# Patient Record
Sex: Female | Born: 1980 | Race: White | Hispanic: No | Marital: Married | Smoking: Current every day smoker
Health system: Southern US, Community
[De-identification: ages and names within clinical notes are randomized; demographics above are authoritative.]

## PROBLEM LIST (undated history)

## (undated) DIAGNOSIS — M5430 Sciatica, unspecified side: Secondary | ICD-10-CM

---

## 2010-08-24 HISTORY — PX: ELBOW SURGERY: SHX618

## 2011-08-25 HISTORY — PX: TOE SURGERY: SHX1073

## 2014-12-05 HISTORY — PX: OTHER SURGICAL HISTORY: SHX169

## 2015-03-17 ENCOUNTER — Emergency Department (HOSPITAL_COMMUNITY): Payer: Medicaid Other

## 2015-03-17 ENCOUNTER — Encounter (HOSPITAL_COMMUNITY): Payer: Self-pay | Admitting: Emergency Medicine

## 2015-03-17 ENCOUNTER — Emergency Department (HOSPITAL_COMMUNITY)
Admission: EM | Admit: 2015-03-17 | Discharge: 2015-03-17 | Disposition: A | Discharge status: Home / Self Care | Payer: Medicaid Other | Attending: Emergency Medicine | Admitting: Emergency Medicine

## 2015-03-17 DIAGNOSIS — S92215A Nondisplaced fracture of cuboid bone of left foot, initial encounter for closed fracture: Secondary | ICD-10-CM | POA: Insufficient documentation

## 2015-03-17 DIAGNOSIS — Y9389 Activity, other specified: Secondary | ICD-10-CM | POA: Insufficient documentation

## 2015-03-17 DIAGNOSIS — Y9289 Other specified places as the place of occurrence of the external cause: Secondary | ICD-10-CM | POA: Diagnosis not present

## 2015-03-17 DIAGNOSIS — Y998 Other external cause status: Secondary | ICD-10-CM | POA: Diagnosis not present

## 2015-03-17 DIAGNOSIS — Z79899 Other long term (current) drug therapy: Secondary | ICD-10-CM | POA: Diagnosis not present

## 2015-03-17 DIAGNOSIS — W010XXA Fall on same level from slipping, tripping and stumbling without subsequent striking against object, initial encounter: Secondary | ICD-10-CM | POA: Insufficient documentation

## 2015-03-17 DIAGNOSIS — S8992XA Unspecified injury of left lower leg, initial encounter: Secondary | ICD-10-CM | POA: Diagnosis present

## 2015-03-17 DIAGNOSIS — S92212A Displaced fracture of cuboid bone of left foot, initial encounter for closed fracture: Secondary | ICD-10-CM

## 2015-03-17 DIAGNOSIS — Z720 Tobacco use: Secondary | ICD-10-CM | POA: Diagnosis not present

## 2015-03-17 DIAGNOSIS — Z8739 Personal history of other diseases of the musculoskeletal system and connective tissue: Secondary | ICD-10-CM | POA: Insufficient documentation

## 2015-03-17 HISTORY — DX: Sciatica, unspecified side: M54.30

## 2015-03-17 MED ORDER — OXYCODONE-ACETAMINOPHEN 5-325 MG PO TABS
1.0000 | ORAL_TABLET | Freq: Once | ORAL | Status: AC
  Administered 2015-03-17: 2 via ORAL
  Filled 2015-03-17: qty 2

## 2015-03-17 MED ORDER — HYDROCODONE-ACETAMINOPHEN 5-325 MG PO TABS: 1.0000 | ORAL_TABLET | ORAL | Status: DC | PRN

## 2015-03-17 MED ORDER — IBUPROFEN 600 MG PO TABS: 600.0000 mg | ORAL_TABLET | Freq: Four times a day (QID) | ORAL | Status: AC | PRN

## 2015-03-17 NOTE — Discharge Instructions (Signed)
You were seen in the ED for your foot pain. Your found to have a foot fracture, specifically the cuboid bone. Please take your medications as directed. Norco for severe pain, Motrin for mild to moderate pain. Follow-up with her primary care in 1 week for reevaluation. Return to ED for new or worsening symptoms.  Fracture A fracture is a break in a bone, due to a force on the bone that is greater than the bone's strength can handle. There are many types of fractures, including:  Complete fracture: The break passes completely through the bone.  Displaced: The ends of the bone fragments are not properly aligned.  Non-displaced: The ends of the bone fragments are in proper alignment.  Incomplete fracture (greenstick): The break does not pass completely through the bone. Incomplete fractures may or may not be angular (angulated).  Open fracture (compound): Part of the broken bone pokes through the skin. Open fractures have a high risk for infection.  Closed fracture: The fracture has not broken through the skin.  Comminuted fracture: The bone is broken into more than two pieces.  Compression fracture: The break occurs from extreme pressure on the bone (includes crushing injury).  Impacted fracture: The broken bone ends have been driven into each other.  Avulsion fracture: A ligament or tendon pulls a small piece of bone off from the main bony segment.  Pathologic fracture: A fracture due to the bone being made weak by a disease (osteoporosis or tumors).  Stress fracture: A fracture caused by intense exercise or repetitive and prolonged pressure that makes the bone weak. SYMPTOMS   Pain, tenderness, bleeding, bruising, and swelling at the fracture site.  Weakness and inability to bear weight on the injured extremity.  Paleness and deformity (sometimes).  Loss of pulse, numbness, tingling, or paralysis below the fracture site (usually a limb); these are emergencies. CAUSES  Bone being  subjected to a force greater than its strength. RISK INCREASES WITH:  Contact sports and falls from heights.  Previous or current bone problems (osteoporosis or tumors).  Poor balance.  Poor strength and flexibility. PREVENTION   Warm up and stretch properly before activity.  Maintain physical fitness:  Cardiovascular fitness.  Muscle strength.  Flexibility and endurance.  Wear proper protective equipment.  Use proper exercise technique. RELATED COMPLICATIONS   Bone fails to heal (nonunion).  Bone heals in a poor position (malunion).  Low blood volume (hypovolemic), shock due to blood loss.  Clump of fat cells travels through the blood (fat embolus) from the injury site to the lungs or brain (more common with thigh fractures).  Obstruction of nearby arteries. TREATMENT  Treatment first requires realigning of the bones (reduction) by a medically trained person, if the fracture is displaced. After realignment if the fracture is completed, or for non-displaced fractures, ice and medicine are used to reduce pain and inflammation. The bone and adjacent joints are then restrained with a splint, cast, or brace to allow the bones to heal without moving. Surgery is sometimes needed, to reposition the bones and hold the position with rods, pins, plates, or screws. Restraint for long periods of time may result in muscle and joint weakness or build up of fluid in tissues (edema). For this reason, physical therapy is often needed to regain strength and full range of motion. Recovery is complete when there is no bone motion at the fracture site and x-rays (radiographs) show complete healing.  MEDICATION   General anesthesia, sedation, or muscle relaxants may be needed  to allow for realignment of the fracture. If pain medicine is needed, nonsteroidal anti-inflammatory medicines (aspirin and ibuprofen), or other minor pain relievers (acetaminophen), are often advised.  Do not take pain  medicine for 7 days before surgery.  Stronger pain relievers may be prescribed by your caregiver. Use only as directed and only as much as you need. SEEK MEDICAL CARE IF:   The following occur after restraint or surgery. (Report any of these signs immediately):  Swelling above or below the fracture site.  Severe, persistent pain.  Blue or gray skin below the fracture site, especially under the nails. Numbness or loss of feeling below the fracture site. Document Released: 08/10/2005 Document Revised: 07/27/2012 Document Reviewed: 11/22/2008 [facility] Patient Information 2015 [place], [place]. This information is not intended to replace advice given to you by your health care provider. Make sure you discuss any questions you have with your health care provider.

## 2015-03-17 NOTE — ED Notes (Signed)
[facility] presents with a 34 yo female with a fall while [place] a water park.  Pt was walking down steps when she missed several steps and landed on left ankle.  Pt stated that she heard a "pop" in the area of her left ankle.  She attempted to bear weight on the left ankle and could not.  [facility] gave a total of 200 mg of fentanyl, including 50 mg at bedside upon arrival.  Pain is currently at 7/10 with some nausea at this time.

## 2015-03-17 NOTE — ED Provider Notes (Signed)
CSN: [id number]     Arrival date & time 03/17/15  1719 History   First MD Initiated Contact with Patient 03/17/15 1743     Chief Complaint  Patient presents with  . Fall  . Leg Pain     (Consider location/radiation/quality/duration/timing/severity/associated sxs/prior Treatment) HPI Jean Jones is a 34 y.o. female who comes in for evaluation of left foot pain. Patient states she was [place] earlier when her flip flop got caught on a step and she tripped and rolled her ankle on the concrete. She reports sudden onset pain rated as 8/10. She denies any numbness, weakness, cool extremities, knee pain. She has not tried anything to improve her symptoms. She reports inability to bear weight. No other aggravating or modifying factors.  Past Medical History  Diagnosis Date  . Sciatica    Past Surgical History  Procedure Laterality Date  . Elbow surgery Right 2012  . Low back surgery  December 05, 2014    because of a ruptured L4-L5  . Toe surgery Bilateral 2013  . Cesarean section      patient has had two C-sections (2004 and 2006)   History reviewed. No pertinent family history. History  Substance Use Topics  . Smoking status: Current Every Day Smoker -- 1.00 packs/day for 24 years    Types: Cigarettes  . Smokeless tobacco: Never Used  . Alcohol Use: No   OB History    No data available     Review of Systems A 10 point review of systems was completed and was negative except for pertinent positives and negatives as mentioned in the history of present illness     Allergies  Review of patient's allergies indicates no known allergies.  Home Medications   Prior to Admission medications   Medication Sig Start Date End Date Taking? Authorizing Provider  gabapentin (NEURONTIN) 300 MG capsule Take 900 mg by mouth 3 (three) times daily.   Yes Historical Provider, MD  HYDROcodone-acetaminophen (NORCO) 5-325 MG per tablet Take 1-2 tablets by mouth every 4 (four) hours as  needed. 03/17/15   Comer Locket, PA-C  ibuprofen (ADVIL,MOTRIN) 600 MG tablet Take 1 tablet (600 mg total) by mouth every 6 (six) hours as needed. 03/17/15   Comer Locket, PA-C  methocarbamol (ROBAXIN) 500 MG tablet TAKE 1 TABLET (500 MG TOTAL) BY MOUTH THREE (3) TIMES A DAY AS NEEDED FOR BACK PAIN 02/07/15  Yes Historical Provider, MD   BP 102/66 mmHg  Pulse 75  Temp(Src) 97.8 F (36.6 C) (Oral)  Resp 16  SpO2 95% Physical Exam  Constitutional:  Awake, alert, nontoxic appearance.  HENT:  Head: Atraumatic.  Eyes: Right eye exhibits no discharge. Left eye exhibits no discharge.  Neck: Neck supple.  Pulmonary/Chest: Effort normal. She exhibits no tenderness.  Abdominal: Soft. There is no tenderness. There is no rebound.  Musculoskeletal: She exhibits no tenderness.  Tenderness palpation over left lateral, dorsal midfoot. Obvious ecchymosis and swelling. Small abrasion noted to left lateral malleolus. No tenderness to posterior aspect of lateral malleolus or medial malleolus. Maintains full passive range of motion. Active range of motion limited secondary only to pain.  Neurological:  Mental status and motor strength appears baseline for patient and situation. Sensation intact to light touch. Motor strength intact.  Skin: No rash noted.  Psychiatric: She has a normal mood and affect.  Nursing note and vitals reviewed.   ED Course  Procedures (including critical care time) Labs Review Labs Reviewed - No data  to display  Imaging Review Dg Ankle Complete Left  03/17/2015   CLINICAL DATA:  Slipped and fell at water park today. Left ankle pain, mainly located laterally. Initial encounter.  EXAM: LEFT ANKLE COMPLETE - 3+ VIEW  COMPARISON:  None.  FINDINGS: Curvilinear lucency projects over the lateral aspect of the cuboid on the AP and mortise views with suggestion of cortical disruption and a minimally displaced fracture. There is likely mild overlying soft tissue swelling. Evaluation  of this region is partially limited by overlapping bones.  The ankle mortise appears intact. No fracture of the distal tibia or fibula is identified. No lytic or blastic osseous lesion is seen. Postsurgical changes are partially visualized to the distal first metatarsal.  IMPRESSION: 1. Possible cuboid fracture. Dedicated foot radiographs recommended for further evaluation. 2. Intact ankle mortise.   Electronically Signed   By: Logan Bores   On: 03/17/2015 18:14   Dg Foot Complete Left  03/17/2015   CLINICAL DATA:  [place] today at [place] water park, walking down steps. Audible pop. Pain with weight-bearing. O  EXAM: LEFT FOOT - COMPLETE 3+ VIEW  COMPARISON:  Ankle radiographs same day.  FINDINGS: Definite nondisplaced fracture of the cuboid. No other acute fracture in the foot. The patient has had previous surgery of the metatarsal head region of the great toe. 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  IMPRESSION: Acute, nondisplaced fracture of the cuboid.   Electronically Signed   By: Nelson Chimes M.D.   On: 03/17/2015 18:54     EKG Interpretation None     Meds given in ED:  Medications  oxyCODONE-acetaminophen (PERCOCET/ROXICET) 5-325 MG per tablet 1-2 tablet (2 tablets Oral Given 03/17/15 1918)    Discharge Medication List as of 03/17/2015  7:16 PM    START taking these medications   Details  HYDROcodone-acetaminophen (NORCO) 5-325 MG per tablet Take 1-2 tablets by mouth every 4 (four) hours as needed., Starting 03/17/2015, Until Discontinued, Print    ibuprofen (ADVIL,MOTRIN) 600 MG tablet Take 1 tablet (600 mg total) by mouth every 6 (six) hours as needed., Starting 03/17/2015, Until Discontinued, Print       Filed Vitals:   03/17/15 1727 03/17/15 1755  BP: 127/78 102/66  Pulse: 76 75  Temp: 97.8 F (36.6 C)   TempSrc: Oral   Resp: 18 16  SpO2: 97% 95%     MDM  Vitals stable - WNL -afebrile Pt resting comfortably  in ED. PE--small abrasion noted to left lateral malleolus with no evidence of open fracture. Imaging--plain films of left ankle inconclusive for possible cuboid fracture. Recommend foot x-ray. Left foot x-ray shows acute, nondisplaced cuboid fracture. Patient given a cam walker boot, crutches, pain medicines for home, anti-inflammatory's. Given referral to orthopedics. Instructions to follow-up with primary care for reevaluation as needed.  I discussed all relevant lab findings and imaging results with pt and they verbalized understanding. Discussed f/u with PCP within 48 hrs and return precautions, pt very amenable to plan. Prior to patient discharge, I discussed and reviewed this case with Dr. Tawnya Crook    Final diagnoses:  Cuboid fracture, left, closed, initial encounter       Comer Locket, PA-C 03/18/15 0122  Ernestina Patches, MD 03/19/15 [phone]

## 2015-03-17 NOTE — ED Notes (Signed)
Bed: WA09 Expected date:  Expected time:  Means of arrival:  Comments: EMS 

## 2019-11-20 ENCOUNTER — Emergency Department (HOSPITAL_BASED_OUTPATIENT_CLINIC_OR_DEPARTMENT_OTHER): Payer: Medicaid Other

## 2019-11-20 ENCOUNTER — Encounter (HOSPITAL_BASED_OUTPATIENT_CLINIC_OR_DEPARTMENT_OTHER): Payer: Self-pay

## 2019-11-20 ENCOUNTER — Emergency Department (HOSPITAL_BASED_OUTPATIENT_CLINIC_OR_DEPARTMENT_OTHER)
Admission: EM | Admit: 2019-11-20 | Discharge: 2019-11-20 | Disposition: A | Discharge status: Home / Self Care | Payer: Medicaid Other | Attending: Emergency Medicine | Admitting: Emergency Medicine

## 2019-11-20 ENCOUNTER — Other Ambulatory Visit: Payer: Self-pay

## 2019-11-20 DIAGNOSIS — D390 Neoplasm of uncertain behavior of uterus: Secondary | ICD-10-CM | POA: Insufficient documentation

## 2019-11-20 DIAGNOSIS — R1030 Lower abdominal pain, unspecified: Secondary | ICD-10-CM

## 2019-11-20 DIAGNOSIS — N858 Other specified noninflammatory disorders of uterus: Secondary | ICD-10-CM

## 2019-11-20 DIAGNOSIS — F17210 Nicotine dependence, cigarettes, uncomplicated: Secondary | ICD-10-CM | POA: Diagnosis not present

## 2019-11-20 DIAGNOSIS — R102 Pelvic and perineal pain: Secondary | ICD-10-CM

## 2019-11-20 LAB — CBC WITH DIFFERENTIAL/PLATELET
Abs Immature Granulocytes: 0.05 10*3/uL (ref 0.00–0.07)
Basophils Absolute: 0 10*3/uL (ref 0.0–0.1)
Basophils Relative: 0 %
Eosinophils Absolute: 0 10*3/uL (ref 0.0–0.5)
Eosinophils Relative: 0 %
HCT: 42.9 % (ref 36.0–46.0)
Hemoglobin: 14.3 g/dL (ref 12.0–15.0)
Immature Granulocytes: 1 %
Lymphocytes Relative: 16 %
Lymphs Abs: 1.8 10*3/uL (ref 0.7–4.0)
MCH: 31.7 pg (ref 26.0–34.0)
MCHC: 33.3 g/dL (ref 30.0–36.0)
MCV: 95.1 fL (ref 80.0–100.0)
Monocytes Absolute: 0.8 10*3/uL (ref 0.1–1.0)
Monocytes Relative: 7 %
Neutro Abs: 8.3 10*3/uL — ABNORMAL HIGH (ref 1.7–7.7)
Neutrophils Relative %: 76 %
Platelets: 175 10*3/uL (ref 150–400)
RBC: 4.51 MIL/uL (ref 3.87–5.11)
RDW: 13 % (ref 11.5–15.5)
WBC: 11 10*3/uL — ABNORMAL HIGH (ref 4.0–10.5)
nRBC: 0 % (ref 0.0–0.2)

## 2019-11-20 LAB — COMPREHENSIVE METABOLIC PANEL
ALT: 13 U/L (ref 0–44)
AST: 13 U/L — ABNORMAL LOW (ref 15–41)
Albumin: 4.3 g/dL (ref 3.5–5.0)
Alkaline Phosphatase: 72 U/L (ref 38–126)
Anion gap: 11 (ref 5–15)
BUN: 18 mg/dL (ref 6–20)
CO2: 21 mmol/L — ABNORMAL LOW (ref 22–32)
Calcium: 8.9 mg/dL (ref 8.9–10.3)
Chloride: 106 mmol/L (ref 98–111)
Creatinine, Ser: 0.87 mg/dL (ref 0.44–1.00)
GFR calc Af Amer: >60 mL/min (ref >60)
GFR calc non Af Amer: >60 mL/min (ref >60)
Glucose, Bld: 96 mg/dL (ref 70–99)
Potassium: 3.5 mmol/L (ref 3.5–5.1)
Sodium: 138 mmol/L (ref 135–145)
Total Bilirubin: 1 mg/dL (ref 0.3–1.2)
Total Protein: 7.5 g/dL (ref 6.5–8.1)

## 2019-11-20 LAB — URINALYSIS, ROUTINE W REFLEX MICROSCOPIC
Bilirubin Urine: NEGATIVE
Glucose, UA: NEGATIVE mg/dL
Hgb urine dipstick: NEGATIVE
Ketones, ur: 15 mg/dL — AB
Leukocytes,Ua: NEGATIVE
Nitrite: NEGATIVE
Protein, ur: NEGATIVE mg/dL
Specific Gravity, Urine: >1.03 — ABNORMAL HIGH (ref 1.005–1.030)
pH: 5.5 (ref 5.0–8.0)

## 2019-11-20 LAB — PREGNANCY, URINE: Preg Test, Ur: NEGATIVE

## 2019-11-20 LAB — BRAIN NATRIURETIC PEPTIDE: B Natriuretic Peptide: 24.7 pg/mL (ref 0.0–100.0)

## 2019-11-20 MED ORDER — DOXYCYCLINE HYCLATE 100 MG PO CAPS: 100.0000 mg | ORAL_CAPSULE | Freq: Two times a day (BID) | ORAL | 0 refills | Status: AC

## 2019-11-20 MED ORDER — ONDANSETRON HCL 4 MG/2ML IJ SOLN
4.0000 mg | Freq: Once | INTRAMUSCULAR | Status: AC
  Administered 2019-11-20: 4 mg via INTRAVENOUS
  Filled 2019-11-20: qty 2

## 2019-11-20 MED ORDER — DOXYCYCLINE HYCLATE 100 MG PO TABS
100.0000 mg | ORAL_TABLET | Freq: Once | ORAL | Status: AC
  Administered 2019-11-20: 100 mg via ORAL
  Filled 2019-11-20: qty 1

## 2019-11-20 MED ORDER — SODIUM CHLORIDE 0.9 % IV BOLUS
1000.0000 mL | Freq: Once | INTRAVENOUS | Status: AC
Start: 2019-11-20 — End: 2019-11-20
  Administered 2019-11-20: 17:00:00 1000 mL via INTRAVENOUS

## 2019-11-20 MED ORDER — LIDOCAINE HCL (PF) 1 % IJ SOLN
INTRAMUSCULAR | Status: AC
  Administered 2019-11-20: 5 mL
  Filled 2019-11-20: qty 5

## 2019-11-20 MED ORDER — MORPHINE SULFATE (PF) 4 MG/ML IV SOLN
4.0000 mg | Freq: Once | INTRAVENOUS | Status: AC
  Administered 2019-11-20: 4 mg via INTRAVENOUS
  Filled 2019-11-20: qty 1

## 2019-11-20 MED ORDER — HYDROCODONE-ACETAMINOPHEN 5-325 MG PO TABS: 1.0000 | ORAL_TABLET | Freq: Four times a day (QID) | ORAL | 0 refills | Status: AC | PRN

## 2019-11-20 MED ORDER — ONDANSETRON HCL 4 MG/2ML IJ SOLN
4.0000 mg | Freq: Once | INTRAMUSCULAR | Status: AC
  Administered 2019-11-20: 19:00:00 4 mg via INTRAVENOUS
  Filled 2019-11-20: qty 2

## 2019-11-20 MED ORDER — HYDROMORPHONE HCL 1 MG/ML IJ SOLN
1.0000 mg | Freq: Once | INTRAMUSCULAR | Status: AC
  Administered 2019-11-20: 1 mg via INTRAVENOUS
  Filled 2019-11-20: qty 1

## 2019-11-20 MED ORDER — CEFTRIAXONE SODIUM 500 MG IJ SOLR
500.0000 mg | Freq: Once | INTRAMUSCULAR | Status: AC
  Administered 2019-11-20: 500 mg via INTRAMUSCULAR
  Filled 2019-11-20: qty 500

## 2019-11-20 MED ORDER — IOHEXOL 300 MG/ML  SOLN
100.0000 mL | Freq: Once | INTRAMUSCULAR | Status: AC | PRN
Start: 2019-11-20 — End: 2019-11-20
  Administered 2019-11-20: 100 mL via INTRAVENOUS

## 2019-11-20 NOTE — ED Triage Notes (Signed)
Pt had urine and pelvic exam at [facility].

## 2019-11-20 NOTE — ED Provider Notes (Signed)
[facility] DEPARTMENT Provider Note   CSN: [id number] Arrival date & time: 11/20/19  1539     History Chief Complaint  Patient presents with  . Abdominal Pain    Jean Jones is a 39 y.o. female who presents for evaluation of lower abdominal pain that began yesterday.  She reports that since then, has gotten progressively worse.  She states it is constant in nature with occasional sharp pains.  She states she has not had any associated urinary symptoms, nausea/vomiting, diarrhea.  Her last bowel movement today was this morning and was normal.  She does report that she takes narcotic pain medication and states stool softeners.  She went to urgent care for evaluation of symptoms and had work-up that was unremarkable and she was sent to the ED for further evaluation.  Patient states that she has taken ibuprofen for the pain with no improvement in symptoms.  She denies any other alleviating or aggravating factors.  She denies any fevers, chest pain, difficulty breathing, dysuria, hematuria, vaginal bleeding, vaginal discharge.  She does not get periods anymore.  The history is provided by the patient.       Past Medical History:  Diagnosis Date  . Sciatica     There are no problems to display for this patient.   Past Surgical History:  Procedure Laterality Date  . CESAREAN SECTION     patient has had two C-sections (2004 and 2006)  . ELBOW SURGERY Right 2012  . low back surgery  December 05, 2014   because of a ruptured L4-L5  . TOE SURGERY Bilateral 2013     OB History   No obstetric history on file.     No family history on file.  Social History   Tobacco Use  . Smoking status: Current Every Day Smoker    Packs/day: 1.00    Years: 24.00    Pack years: 24.00    Types: Cigarettes  . Smokeless tobacco: Never Used  Substance Use Topics  . Alcohol use: No  . Drug use: No    Home Medications Prior to Admission medications   Medication Sig Start  Date End Date Taking? Authorizing Provider  doxycycline (VIBRAMYCIN) 100 MG capsule Take 1 capsule (100 mg total) by mouth 2 (two) times daily. 11/20/19   Volanda Napoleon, PA-C  gabapentin (NEURONTIN) 300 MG capsule Take 900 mg by mouth 3 (three) times daily.    [provider]  HYDROcodone-acetaminophen (NORCO/VICODIN) 5-325 MG tablet Take 1-2 tablets by mouth every 6 (six) hours as needed. 11/20/19   Volanda Napoleon, PA-C  ibuprofen (ADVIL,MOTRIN) 600 MG tablet Take 1 tablet (600 mg total) by mouth every 6 (six) hours as needed. 03/17/15   Cartner, Marland Kitchen, PA-C  methocarbamol (ROBAXIN) 500 MG tablet TAKE 1 TABLET (500 MG TOTAL) BY MOUTH THREE (3) TIMES A DAY AS NEEDED FOR BACK PAIN 02/07/15   [provider]    Allergies    Patient has no known allergies.  Review of Systems   Review of Systems  Constitutional: Negative for fever.  Respiratory: Negative for cough and shortness of breath.   Cardiovascular: Negative for chest pain.  Gastrointestinal: Positive for abdominal pain. Negative for nausea and vomiting.  Genitourinary: Negative for dysuria and hematuria.  Neurological: Negative for weakness, numbness and headaches.  All other systems reviewed and are negative.   Physical Exam Updated Vital Signs BP 126/76 (BP Location: Right Arm)   Pulse 71   Temp 98.4 F (  36.9 C)   Resp 14   Ht 5\' 4"  (1.626 m)   Wt 81.6 kg   SpO2 99%   BMI 30.90 kg/m   Physical Exam Vitals and nursing note reviewed.  Constitutional:      Appearance: Normal appearance. She is well-developed.     Comments: Appears uncomfortable   HENT:     Head: Normocephalic and atraumatic.  Eyes:     General: Lids are normal.     Conjunctiva/sclera: Conjunctivae normal.     Pupils: Pupils are equal, round, and reactive to light.  Cardiovascular:     Rate and Rhythm: Normal rate and regular rhythm.     Pulses: Normal pulses.     Heart sounds: Normal heart sounds. No murmur. No friction rub.  No gallop.   Pulmonary:     Effort: Pulmonary effort is normal.     Breath sounds: Normal breath sounds.     Comments: Lungs clear to auscultation bilaterally.  Symmetric chest rise.  No wheezing, rales, rhonchi. Abdominal:     Palpations: Abdomen is soft. Abdomen is not rigid.     Tenderness: There is abdominal tenderness in the right lower quadrant, suprapubic area and left lower quadrant. There is right CVA tenderness and left CVA tenderness. There is no guarding.     Comments: Abdomen is soft, nondistended.  Tenderness palpation noted diffusely to the lower abdomen.  No focal tenderness noted.  No rigidity, guarding.  She has bilateral CVA tenderness but is slightly worse than the right.  Musculoskeletal:        General: Normal range of motion.     Cervical back: Full passive range of motion without pain.  Skin:    General: Skin is warm and dry.     Capillary Refill: Capillary refill takes less than 2 seconds.  Neurological:     Mental Status: She is alert and oriented to person, place, and time.  Psychiatric:        Speech: Speech normal.     ED Results / Procedures / Treatments   Labs (all labs ordered are listed, but only abnormal results are displayed) Labs Reviewed  URINALYSIS, ROUTINE W REFLEX MICROSCOPIC - Abnormal; Notable for the following components:      Result Value   APPearance CLOUDY (*)    Specific Gravity, Urine >1.030 (*)    Ketones, ur 15 (*)    All other components within normal limits  COMPREHENSIVE METABOLIC PANEL - Abnormal; Notable for the following components:   CO2 21 (*)    AST 13 (*)    All other components within normal limits  CBC WITH DIFFERENTIAL/PLATELET - Abnormal; Notable for the following components:   WBC 11.0 (*)    Neutro Abs 8.3 (*)    All other components within normal limits  PREGNANCY, URINE  BRAIN NATRIURETIC PEPTIDE    EKG None  Radiology CT ABDOMEN PELVIS W CONTRAST  Result Date: 11/20/2019 CLINICAL DATA:  Lower  abdominal pain EXAM: CT ABDOMEN AND PELVIS WITH CONTRAST TECHNIQUE: Multidetector CT imaging of the abdomen and pelvis was performed using the standard protocol following bolus administration of intravenous contrast. CONTRAST:  131mL OMNIPAQUE IOHEXOL 300 MG/ML  SOLN COMPARISON:  CT report 06/07/2013 FINDINGS: Lower chest: Lung bases demonstrate no acute consolidation or pleural effusion. The heart size is within normal limits. Hepatobiliary: No focal liver abnormality is seen. No gallstones, gallbladder wall thickening, or biliary dilatation. Pancreas: Unremarkable. No pancreatic ductal dilatation or surrounding inflammatory changes. Spleen: Normal in size  without focal abnormality. Adrenals/Urinary Tract: Adrenal glands are normal. Mild right hydronephrosis and hydroureter, no definitive stones seen. Distal right ureter at the pelvis is poorly visible, secondary to enlarged uterus with mass lesion. The bladder is normal Stomach/Bowel: Stomach is within normal limits. Appendix appears normal. No evidence of bowel wall thickening, distention, or inflammatory changes. Vascular/Lymphatic: No significant vascular findings are present. No enlarged abdominal or pelvic lymph nodes. Reproductive: Uterus is enlarged. 7.8 cm hypodense mass within the right aspect of the uterus presumably a degenerated fibroid. Small left adnexal cyst. Mild soft tissue stranding and fluid in the right adnexa, best seen on coronal views, series 5, image number 38. No definitive adnexal mass. Other: No free air.  Trace free fluid in the pelvis Musculoskeletal: No acute or suspicious osseous abnormality. IMPRESSION: 1. Negative for acute appendicitis. 2. Hazy infiltration and small fluid in the right adnexa suggest right adnexal inflammatory process, question PID or potential ruptured cyst. If symptoms are suggestive of torsion, further evaluation with pelvic ultrasound and Doppler could be obtained. 3. Enlarged uterus with 7.8 cm hypodense  mass, presumably a degenerated fibroid. There is mild right hydronephrosis and hydroureter, suspect that distal right ureter is slightly obstructed by the enlarged uterus/uterine mass. Electronically Signed   By: Donavan Foil M.D.   On: 11/20/2019 18:14   US PELVIC COMPLETE W TRANSVAGINAL AND TORSION R/O  Result Date: 11/20/2019 CLINICAL DATA:  Pelvic pain EXAM: TRANSABDOMINAL AND TRANSVAGINAL ULTRASOUND OF PELVIS DOPPLER ULTRASOUND OF OVARIES TECHNIQUE: Both transabdominal and transvaginal ultrasound examinations of the pelvis were performed. Transabdominal technique was performed for global imaging of the pelvis including uterus, ovaries, adnexal regions, and pelvic cul-de-sac. It was necessary to proceed with endovaginal exam following the transabdominal exam to visualize the uterus and adnexa. Color and duplex Doppler ultrasound was utilized to evaluate blood flow to the ovaries. COMPARISON:  CT 11/20/2019 FINDINGS: Uterus Measurements: 12.3 x 10.1 x 10.2 cm = volume: 665 mL. Large heterogeneous central to fundal mass measuring 7.4 x 8.4 x 7.7 cm. Echogenic areas are present within the mass. Endometrium Unable to measure, obscured by large uterine mass Right ovary Measurements: 2.9 x 1.2 x 2.6 cm = volume: 4.7 mL. Poorly visible. Unable to visualize on transvaginal images. Left ovary Measurements: 2.3 x 1.3 x 1.2 cm = volume: 1.9 mL. Normal appearance/no adnexal mass. Pulsed Doppler evaluation of both ovaries demonstrates normal low-resistance arterial and venous waveforms in the left ovary. Color flow is present in the right ovary but waveforms could not be documented. Other findings Small free fluid in the pelvis. IMPRESSION: 1. Enlarged uterus. Large 8.4 cm heterogeneous central mass, indeterminate for endometrial mass versus sub mucosal fibroid. Echogenic areas within the mass could be due to foci of hemorrhage or infarct. Consider surgical consultation. 2. Negative for left ovarian torsion. Right ovary  was poorly visible and seen only on transabdominal images. Color flow was present within the right ovary but waveforms could not be documented secondary to poorly visible right ovary. 3. Small free fluid in the pelvis Electronically Signed   By: Donavan Foil M.D.   On: 11/20/2019 20:48    Procedures Procedures (including critical care time)  Medications Ordered in ED Medications  morphine 4 MG/ML injection 4 mg (4 mg Intravenous Given 11/20/19 1653)  ondansetron (ZOFRAN) injection 4 mg (4 mg Intravenous Given 11/20/19 1652)  sodium chloride 0.9 % bolus 1,000 mL ( Intravenous Stopped 11/20/19 1748)  iohexol (OMNIPAQUE) 300 MG/ML solution 100 mL (100 mLs Intravenous Contrast  Given 11/20/19 1745)  HYDROmorphone (DILAUDID) injection 1 mg (1 mg Intravenous Given 11/20/19 1839)  ondansetron (ZOFRAN) injection 4 mg (4 mg Intravenous Given 11/20/19 1838)  cefTRIAXone (ROCEPHIN) injection 500 mg (500 mg Intramuscular Given 11/20/19 2115)  doxycycline (VIBRA-TABS) tablet 100 mg (100 mg Oral Given 11/20/19 2116)  lidocaine (PF) (XYLOCAINE) 1 % injection (5 mLs  Given 11/20/19 2116)    ED Course  I have reviewed the triage vital signs and the nursing notes.  Pertinent labs & imaging results that were available during my care of the patient were reviewed by me and considered in my medical decision making (see chart for details).    MDM Rules/Calculators/A&P                      40 year old female who presents for evaluation of lower abdominal pain that began yesterday.  No associated fevers, nausea/vomiting, diarrhea, urinary complaints.  Seen at urgent care and instructed to go to the ED for further evaluation. Patient is afebrile, non-toxic appearing, sitting comfortably on examination table. Vital signs reviewed and stable.  On exam, she has tenderness palpation noted to lower abdomen.  Plan for labs, CT scan.  CBC shows slight leukocytosis of 11.0.  CMP is unremarkable.  UA negative for any infectious  etiology.  Urine pregnancy negative.  CT on pelvis negative for acute ascites.  There is hazy infiltration and small fluid in the right adnexa suggesting right adnexal inflammatory process.  Question PID or potential ruptured cyst.  She also has a large uterus with 7.8 cm hypodense mass presumably a fibroid.  Reevaluation.  Patient reports she still having pain 7/10.  Will give additional analgesics.  Given continued pain, will plan for ultrasound to ensure she has no evidence of ovarian torsion.  I did also discussed with patient regarding findings on CT scan that could be concerning for PID.  She does report she had pain at the pelvic exam.  I offered to repeat it here in emergency department patient patient declined.  I discussed with patient regarding possible treatment for PID.  Patient reports that she would prefer just to be treated without repeating the pelvic exam.  RN inform me that patient wanted to leave AMA.  I went to discuss with patient.  She was upset.  I discussed with her regarding her concerns.  I encouraged her to wait till ultrasound came back.  She was agreeable.  Ultrasound shows enlarged uterus with a large 8.4 cm heterogenous central mass.  It could be an endometrial mass versus fibroid.  Ultrasound shows no evidence of left ovarian torsion.  The right ovary is poorly visible and seen only on transabdominal images.  Color flow was present within the right ovary but waveforms could not be documented secondary to poorly distal right ovary.  Discussed patient with Dr. Hulan Fray (OB/GYN) who reviewed patient's ultrasound and CT scan.  She suspect this may be a degenerate for uterine fibroid which she states can cause significant pain.  No indication for emergent surgical intervention.  She suspects that this may likely lead to a hysterectomy somewhere in the future.  Additionally, she evaluated findings of ovary and states that are not concerning.  Recommends pain control, follow-up with  GYN.  I discussed results with patient.  She has an OB/GYN established.  She has been told she had enlarged uterus before and states that there was some fibroid or something in the uterus that she had had previously.  I discussed  with her that this is most likely the cause of her pain.  We did previously discussed that since she had tenderness with the pelvic exam to treat her as possible PID.  Patient is agreeable.  Patient with no known drug allergies.  Patient does take chronic pain medication.  Her last prescription was September 25, 2019.  We will plan to give her a short course of acute pain medication till she get in with her OB/GYN. At this time, patient exhibits no emergent life-threatening condition that require further evaluation in ED or admission. Patient had ample opportunity for questions and discussion. All patient's questions were answered with full understanding. Strict return precautions discussed. Patient expresses understanding and agreement to plan.    Portions of this note were generated with [employer]. Dictation errors may occur despite best attempts at proofreading.  Final Clinical Impression(s) / ED Diagnoses Final diagnoses:  Lower abdominal pain  Uterine mass    Rx / DC Orders ED Discharge Orders         Ordered    HYDROcodone-acetaminophen (NORCO/VICODIN) 5-325 MG tablet  Every 6 hours PRN     11/20/19 2104    doxycycline (VIBRAMYCIN) 100 MG capsule  2 times daily     11/20/19 2105           Volanda Napoleon, PA-C 11/20/19 2252    Maudie Flakes, MD 11/21/19 [phone]

## 2019-11-20 NOTE — ED Notes (Signed)
Patient informed that due to pain medication administered she should not drive for 4 hrs. Patient told to call husband after noting that patient wanted underage daughter to drive her home. Patient acknowledged.

## 2019-11-20 NOTE — ED Triage Notes (Signed)
Pt states that she has been having lower abdominal pain since yesterday. Denies urinary symptoms, denies NVD.

## 2019-11-20 NOTE — Discharge Instructions (Addendum)
Take antibiotics as directed. Please take all of your antibiotics until finished.   You can take Tylenol or Ibuprofen as directed for pain. You can alternate Tylenol and Ibuprofen every 4 hours. If you take Tylenol at 1pm, then you can take Ibuprofen at 5pm. Then you can take Tylenol again at 9pm.   Take pain medications as directed for break through pain. Do not drive or operate machinery while taking this medication.   As we discussed, you will need to follow-up with your OB/GYN.  Your imaging today showed a enlarged uterus as well as a uterine mass that is most likely a fibroid.  This may likely be the cause of your pain.  Return the emergency department for any worsening pain, vomiting, fevers or any other worsening or concerning symptoms.

## 2020-11-04 IMAGING — CT CT ABD-PELV W/ CM
2 of 5 series · 16 of 46 positions shown, 18 images · IV contrast (Omnipaque)
Comparison: CT report 06/07/2013

CLINICAL DATA: Lower abdominal pain

EXAM:
CT ABDOMEN AND PELVIS WITH CONTRAST
TECHNIQUE: Multidetector CT imaging of the abdomen and pelvis was performed
using the standard protocol following bolus administration of
intravenous contrast.
CONTRAST:  100mL OMNIPAQUE IOHEXOL 300 MG/ML  SOLN

[Series 2: axial st · axial · 0.76mm/px · z∈[-522,-97]mm · 13 of 97 slices shown, 15 images]
[im 6/97  soft-tissue]
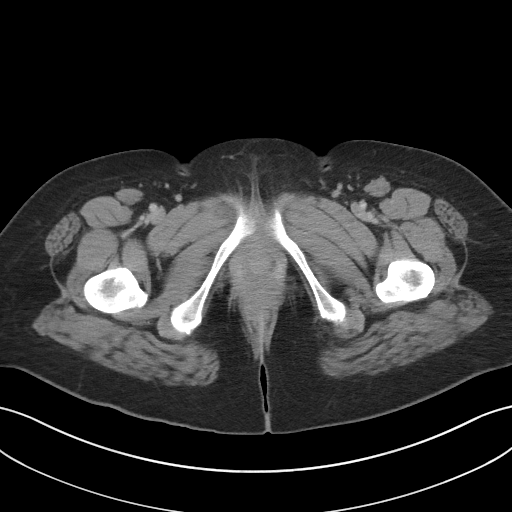
[im 6/97  bone]
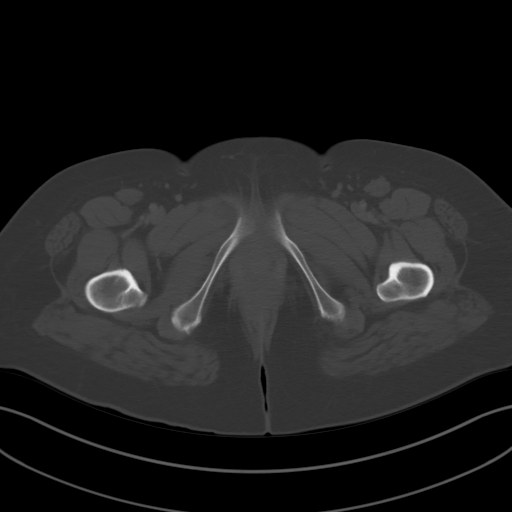
[im 16/97  soft-tissue]
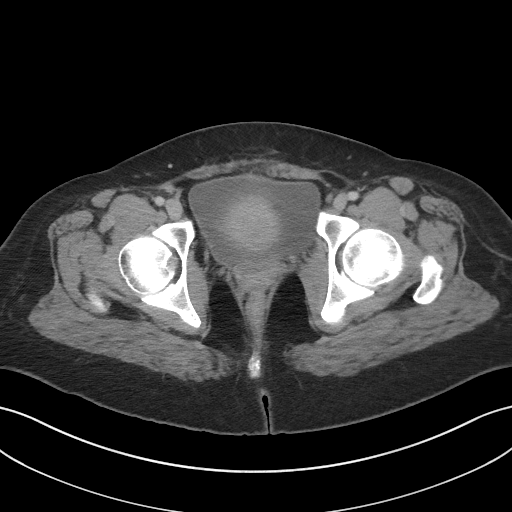
[im 21/97  soft-tissue]
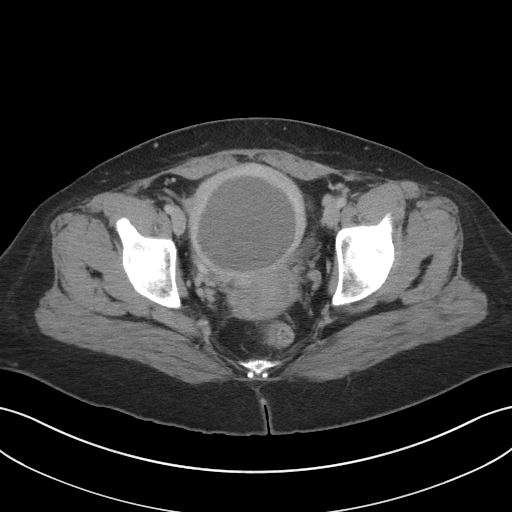
[im 26/97  soft-tissue]
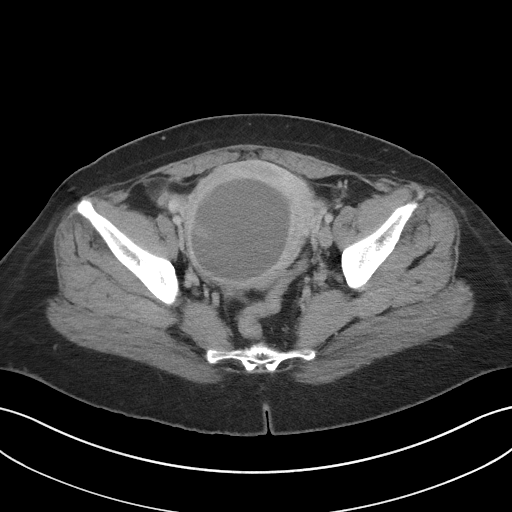
[im 36/97  soft-tissue]
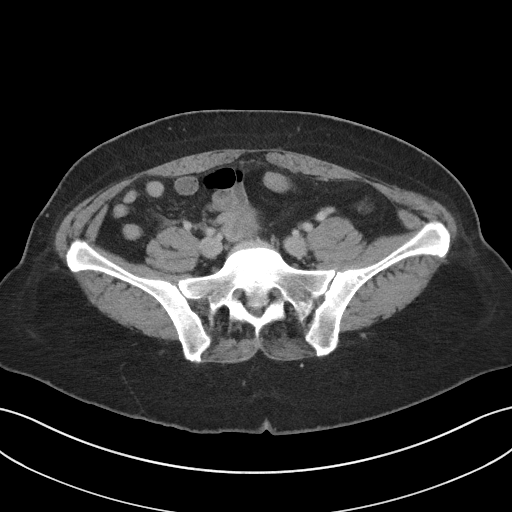
[im 41/97  soft-tissue]
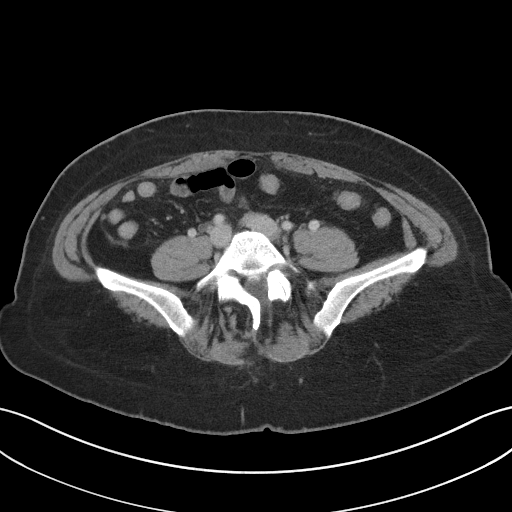
[im 51/97  soft-tissue]
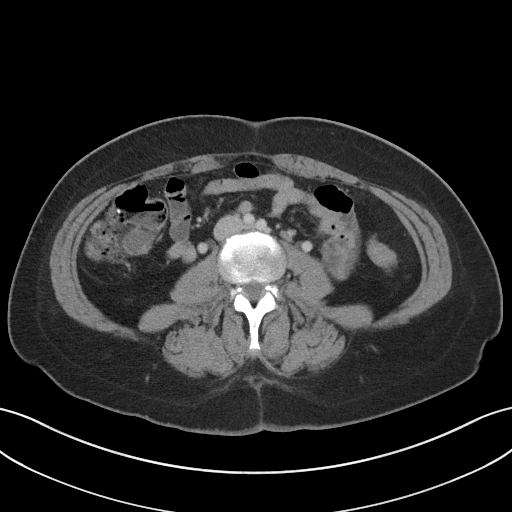
[im 56/97  soft-tissue]
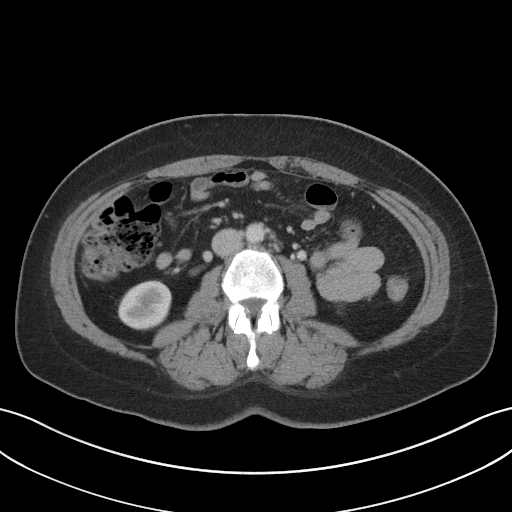
[im 61/97  soft-tissue]
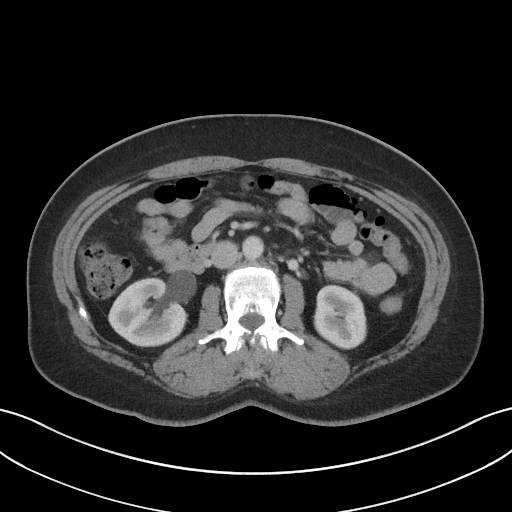
[im 61/97  bone]
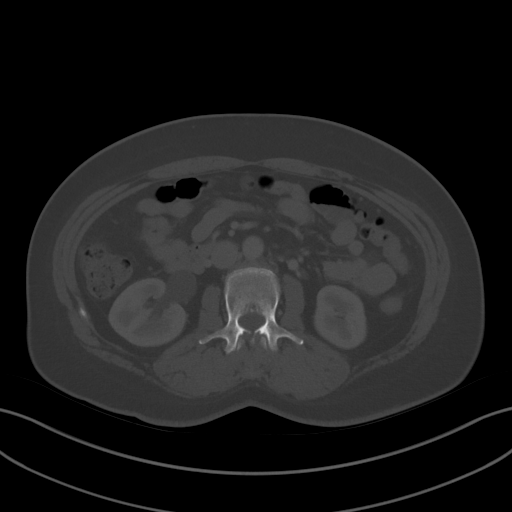
[im 71/97  soft-tissue]
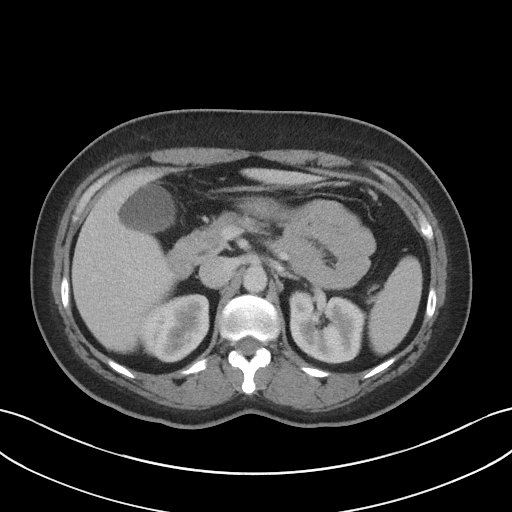
[im 76/97  soft-tissue]
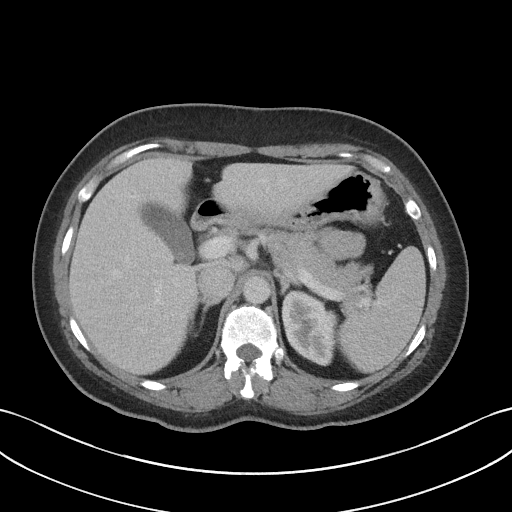
[im 81/97  soft-tissue]
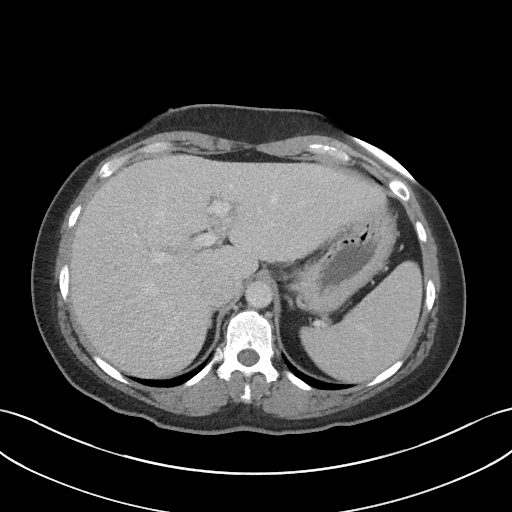
[im 91/97  soft-tissue]
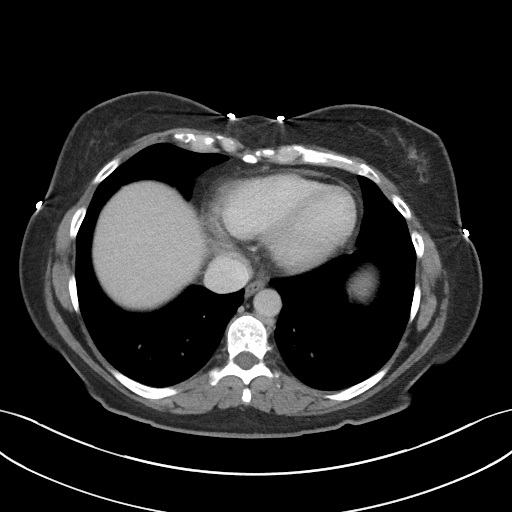

[Series 5: coronal st · coronal · 0.74mm/px · 3 of 101 slices shown]
[im 34/101  soft-tissue]
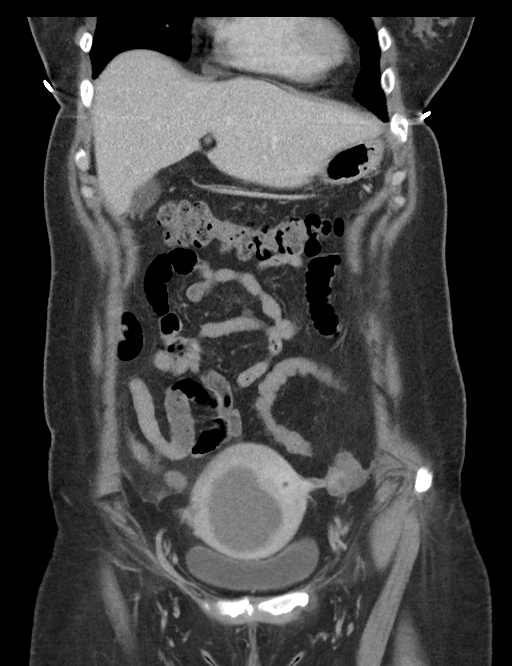
[im 45/101  soft-tissue]
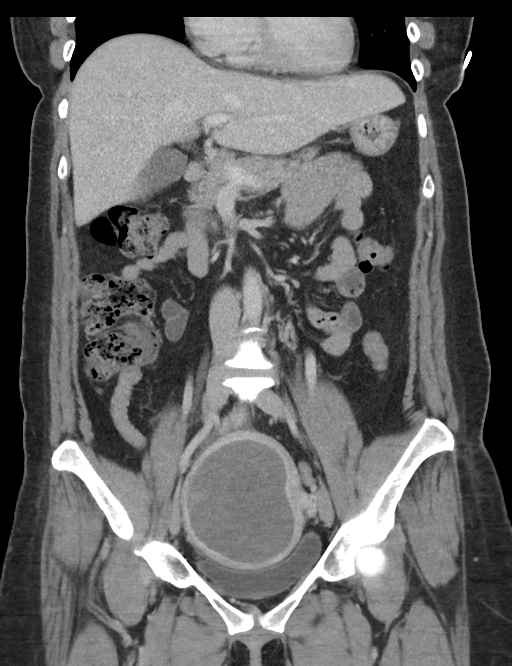
[im 56/101  soft-tissue]
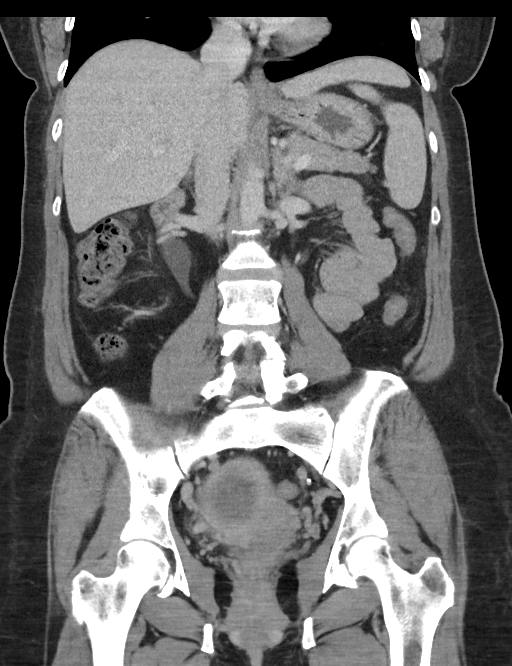

[16 of 46 positions shown; findings below may reference images not displayed]

FINDINGS: Lower chest: Lung bases demonstrate no acute consolidation or
pleural effusion. The heart size is within normal limits.

Hepatobiliary: No focal liver abnormality is seen. No gallstones,
gallbladder wall thickening, or biliary dilatation.

Pancreas: Unremarkable. No pancreatic ductal dilatation or
surrounding inflammatory changes.

Spleen: Normal in size without focal abnormality.

Adrenals/Urinary Tract: Adrenal glands are normal. Mild right
hydronephrosis and hydroureter, no definitive stones seen. Distal
right ureter at the pelvis is poorly visible, secondary to enlarged
uterus with mass lesion. The bladder is normal

Stomach/Bowel: Stomach is within normal limits. Appendix appears
normal. No evidence of bowel wall thickening, distention, or
inflammatory changes.

Vascular/Lymphatic: No significant vascular findings are present. No
enlarged abdominal or pelvic lymph nodes.

Reproductive: Uterus is enlarged. 7.8 cm hypodense mass within the
right aspect of the uterus presumably a degenerated fibroid. Small
left adnexal cyst. Mild soft tissue stranding and fluid in the right
adnexa, best seen on coronal views, series 5, image number 38. No
definitive adnexal mass.

Other: No free air.  Trace free fluid in the pelvis

Musculoskeletal: No acute or suspicious osseous abnormality.
IMPRESSION: 1. Negative for acute appendicitis.
2. Hazy infiltration and small fluid in the right adnexa suggest
right adnexal inflammatory process, question PID or potential
ruptured cyst. If symptoms are suggestive of torsion, further
evaluation with pelvic ultrasound and Doppler could be obtained.
3. Enlarged uterus with 7.8 cm hypodense mass, presumably a
degenerated fibroid. There is mild right hydronephrosis and
hydroureter, suspect that distal right ureter is slightly obstructed
by the enlarged uterus/uterine mass.
